# Patient Record
Sex: Female | Born: 1965 | Race: Asian | Hispanic: No | Smoking: Former smoker
Health system: Southern US, Community
[De-identification: ages and names within clinical notes are randomized; demographics above are authoritative.]

## PROBLEM LIST (undated history)

## (undated) DIAGNOSIS — R87619 Unspecified abnormal cytological findings in specimens from cervix uteri: Secondary | ICD-10-CM

## (undated) DIAGNOSIS — E079 Disorder of thyroid, unspecified: Secondary | ICD-10-CM

## (undated) DIAGNOSIS — A64 Unspecified sexually transmitted disease: Secondary | ICD-10-CM

## (undated) DIAGNOSIS — R000 Tachycardia, unspecified: Secondary | ICD-10-CM

## (undated) DIAGNOSIS — M5126 Other intervertebral disc displacement, lumbar region: Secondary | ICD-10-CM

## (undated) HISTORY — DX: Tachycardia, unspecified: R00.0

## (undated) HISTORY — PX: BREAST CYST EXCISION: SHX579

## (undated) HISTORY — PX: BREAST BIOPSY: SHX20

## (undated) HISTORY — DX: Disorder of thyroid, unspecified: E07.9

## (undated) HISTORY — DX: Unspecified sexually transmitted disease: A64

## (undated) HISTORY — DX: Other intervertebral disc displacement, lumbar region: M51.26

## (undated) HISTORY — DX: Unspecified abnormal cytological findings in specimens from cervix uteri: R87.619

---

## 2021-10-20 ENCOUNTER — Ambulatory Visit: Payer: Self-pay | Admitting: Family

## 2021-11-17 ENCOUNTER — Ambulatory Visit: Payer: BC Managed Care – PPO | Admitting: Family

## 2021-11-17 ENCOUNTER — Other Ambulatory Visit: Payer: Self-pay

## 2021-11-17 ENCOUNTER — Encounter: Payer: Self-pay | Admitting: Family

## 2021-11-17 VITALS — BP 118/80 | HR 89 | Temp 98.1°F | Ht 62.0 in | Wt 128.6 lb

## 2021-11-17 DIAGNOSIS — Z1211 Encounter for screening for malignant neoplasm of colon: Secondary | ICD-10-CM

## 2021-11-17 DIAGNOSIS — I10 Essential (primary) hypertension: Secondary | ICD-10-CM

## 2021-11-17 DIAGNOSIS — Z124 Encounter for screening for malignant neoplasm of cervix: Secondary | ICD-10-CM

## 2021-11-17 DIAGNOSIS — Z1231 Encounter for screening mammogram for malignant neoplasm of breast: Secondary | ICD-10-CM | POA: Diagnosis not present

## 2021-11-17 NOTE — Progress Notes (Signed)
New Patient Office Visit  Subjective:  Patient ID: Maria Shannon, female    DOB: 07-23-1966  Age: 56 y.o. MRN: [id number]  CC:  Chief Complaint  Patient presents with   Establish Care   Hypertension    HPI Maria Shannon presents for establishing care and to discuss one problem. Hypertension: Patient is currently maintained on the following medications for blood pressure: Metoprolol Patient reports good compliance with blood pressure medications. Patient denies chest pain, headaches, shortness of breath or swelling. Last 3 blood pressure readings in our office are as follows: BP Readings from Last 3 Encounters:  11/17/21 118/80   History reviewed. No pertinent past medical history.  History reviewed. No pertinent surgical history.  Family History  Problem Relation Age of Onset   Diabetes Mother    Cancer Mother    Kidney disease Father    Heart disease Father    Diabetes Sister     Social History   Socioeconomic History   Marital status: Single    Spouse name: Not on file   Number of children: Not on file   Years of education: Not on file   Highest education level: Not on file  Occupational History   Not on file  Tobacco Use   Smoking status: Former    Types: Cigarettes   Smokeless tobacco: Never  Substance and Sexual Activity   Alcohol use: Yes    Alcohol/week: 1.0 standard drink    Types: 1 Glasses of wine per week   Drug use: Never   Sexual activity: Not Currently  Other Topics Concern   Not on file  Social History Narrative   Not on file   Social Determinants of Health   Financial Resource Strain: Not on file  Food Insecurity: Not on file  Transportation Needs: Not on file  Physical Activity: Not on file  Stress: Not on file  Social Connections: Not on file  Intimate Partner Violence: Not on file    Objective:   Today's Vitals: BP 118/80    Pulse 89    Temp 98.1 F (36.7 C) (Temporal)    Ht 5\' 2"  (1.575 m)    Wt 128 lb 9.6 oz (58.3  kg)    SpO2 99%    BMI 23.52 kg/m   Physical Exam Vitals and nursing note reviewed.  Constitutional:      Appearance: Normal appearance.  Cardiovascular:     Rate and Rhythm: Normal rate and regular rhythm.  Pulmonary:     Effort: Pulmonary effort is normal.     Breath sounds: Normal breath sounds.  Musculoskeletal:        General: Normal range of motion.  Skin:    General: Skin is warm and dry.  Neurological:     Mental Status: She is alert.  Psychiatric:        Mood and Affect: Mood normal.        Behavior: Behavior normal.    Assessment & Plan:   Problem List Items Addressed This Visit       Cardiovascular and Mediastinum   Essential hypertension - Primary    Chronic - pt takes Metoprolol mostly for tachycardia, but reports having high BP also, pt has monitor at home - reports up to 163 systolic and she will have a HA. States after taking Metoprolol it comes down some but not to normal. Advised pt we can add another med, prefer not to increase Metoprolol, HR is doing good. pt will schedule a f/u physical  soon.      Relevant Medications   metoprolol succinate (TOPROL-XL) 25 MG 24 hr tablet   Other Visit Diagnoses     Colon cancer screening       Relevant Orders   Ambulatory referral to Gastroenterology   Encounter for screening mammogram for malignant neoplasm of breast       Relevant Orders   MM DIGITAL SCREENING BILATERAL   Cervical cancer screening       Relevant Orders   Ambulatory referral to Obstetrics / Gynecology       Outpatient Encounter Medications as of 11/17/2021  Medication Sig   metoprolol succinate (TOPROL-XL) 25 MG 24 hr tablet Take 25 mg by mouth daily.   No facility-administered encounter medications on file as of 11/17/2021.    Follow-up: Return for Complete physical w/fasting labs.   Maria Sewer, NP

## 2021-11-17 NOTE — Patient Instructions (Signed)
Welcome to [facility]! It was a pleasure meeting you today.  As discussed, I have sent referrals to Gastroenterology, the [facility], and to our Gynecology offices. They will call you directly to schedule appointments.  Please schedule a physical today for fasting labs.     PLEASE NOTE:  If you had any LAB tests please let us know if you have not heard back within a few days. You may see your results on MyChart before we have a chance to review them but we will give you a call once they are reviewed by [place]. If we ordered any REFERRALS today, please let us know if you have not heard from their office within the next week.  Let us know through MyChart if you are needing REFILLS, or have your pharmacy send [place] the request. You can also use MyChart to communicate with me or any office staff.  Please try these tips to maintain a healthy lifestyle:  Eat most of your calories during the day when you are active. Eliminate processed foods including packaged sweets (pies, cakes, cookies), reduce intake of potatoes, white bread, white pasta, and white rice. Look for whole grain options, oat flour or almond flour.  Each meal should contain half fruits/vegetables, one quarter protein, and one quarter carbs (no bigger than a computer mouse).  Cut down on sweet beverages. This includes juice, soda, and sweet tea. Also watch fruit intake, though this is a healthier sweet option, it still contains natural sugar! Limit to 3 servings daily.  Drink at least 1 glass of water with each meal and aim for at least 8 glasses per day  Exercise at least 150 minutes every week.

## 2021-11-17 NOTE — Assessment & Plan Note (Addendum)
Chronic - pt takes Metoprolol mostly for tachycardia, but reports having high BP also, pt has monitor at home - reports up to 809 systolic and she will have a HA. States after taking Metoprolol it comes down some but not to normal. Advised pt we can add another med, prefer not to increase Metoprolol, HR is doing good. pt will schedule a f/u physical soon.

## 2021-12-14 ENCOUNTER — Encounter: Payer: Self-pay | Admitting: Gastroenterology

## 2021-12-22 ENCOUNTER — Ambulatory Visit (AMBULATORY_SURGERY_CENTER): Payer: BC Managed Care – PPO | Admitting: *Deleted

## 2021-12-22 VITALS — Ht 62.0 in | Wt 128.0 lb

## 2021-12-22 DIAGNOSIS — Z1211 Encounter for screening for malignant neoplasm of colon: Secondary | ICD-10-CM

## 2021-12-22 MED ORDER — NA SULFATE-K SULFATE-MG SULF 17.5-3.13-1.6 GM/177ML PO SOLN: 2.0000 | Freq: Once | ORAL | 0 refills | Status: AC

## 2021-12-22 NOTE — Progress Notes (Signed)

## 2022-01-01 ENCOUNTER — Encounter: Payer: Self-pay | Admitting: Gastroenterology

## 2022-01-07 ENCOUNTER — Telehealth: Payer: Self-pay

## 2022-01-07 NOTE — Telephone Encounter (Signed)
Left message for pt to call office back regarding referral that was sent to [facility].  

## 2022-01-12 ENCOUNTER — Encounter: Payer: Self-pay | Admitting: Gastroenterology

## 2022-01-12 ENCOUNTER — Ambulatory Visit (AMBULATORY_SURGERY_CENTER): Payer: BC Managed Care – PPO | Admitting: Gastroenterology

## 2022-01-12 VITALS — BP 119/67 | HR 68 | Temp 98.9°F | Resp 11 | Ht 62.0 in | Wt 128.0 lb

## 2022-01-12 DIAGNOSIS — Z1211 Encounter for screening for malignant neoplasm of colon: Secondary | ICD-10-CM

## 2022-01-12 DIAGNOSIS — D125 Benign neoplasm of sigmoid colon: Secondary | ICD-10-CM

## 2022-01-12 DIAGNOSIS — D122 Benign neoplasm of ascending colon: Secondary | ICD-10-CM | POA: Diagnosis not present

## 2022-01-12 DIAGNOSIS — K5730 Diverticulosis of large intestine without perforation or abscess without bleeding: Secondary | ICD-10-CM

## 2022-01-12 MED ORDER — SODIUM CHLORIDE 0.9 % IV SOLN: 500.0000 mL | INTRAVENOUS | Status: DC

## 2022-01-12 NOTE — Patient Instructions (Signed)
YOU HAD AN ENDOSCOPIC PROCEDURE TODAY AT [facility]:   Refer to the procedure report that was given to you for any specific questions about what was found during the examination.  If the procedure report does not answer your questions, please call your gastroenterologist to clarify.  If you requested that your care partner not be given the details of your procedure findings, then the procedure report has been included in a sealed envelope for you to review at your convenience later. ? ?**Handout given on polyps and diverticulosis** ? ?YOU SHOULD EXPECT: Some feelings of bloating in the abdomen. Passage of more gas than usual.  Walking can help get rid of the air that was put into your GI tract during the procedure and reduce the bloating. If you had a lower endoscopy (such as a colonoscopy or flexible sigmoidoscopy) you may notice spotting of blood in your stool or on the toilet paper. If you underwent a bowel prep for your procedure, you may not have a normal bowel movement for a few days. ? ?Please Note:  You might notice some irritation and congestion in your nose or some drainage.  This is from the oxygen used during your procedure.  There is no need for concern and it should clear up in a day or so. ? ?SYMPTOMS TO REPORT IMMEDIATELY: ? ?Following lower endoscopy (colonoscopy or flexible sigmoidoscopy): ? Excessive amounts of blood in the stool ? Significant tenderness or worsening of abdominal pains ? Swelling of the abdomen that is new, acute ? Fever of 100?F or higher ? ?For urgent or emergent issues, a gastroenterologist can be reached at any hour by calling [phone]. ?Do not use MyChart messaging for urgent concerns.  ? ? ?DIET:  We do recommend a small meal at first, but then you may proceed to your regular diet.  Drink plenty of fluids but you should avoid alcoholic beverages for 24 hours. ? ?ACTIVITY:  You should plan to take it easy for the rest of today and you should NOT DRIVE  or use heavy machinery until tomorrow (because of the sedation medicines used during the test).   ? ?FOLLOW UP: ?Our staff will call the number listed on your records 48-72 hours following your procedure to check on you and address any questions or concerns that you may have regarding the information given to you following your procedure. If we do not reach you, we will leave a message.  We will attempt to reach you two times.  During this call, we will ask if you have developed any symptoms of COVID 19. If you develop any symptoms (ie: fever, flu-like symptoms, shortness of breath, cough etc.) before then, please call [phone].  If you test positive for Covid 19 in the 2 weeks post procedure, please call and report this information to [place].   ? ?If any biopsies were taken you will be contacted by phone or by letter within the next 1-3 weeks.  Please call us at [phone] if you have not heard about the biopsies in 3 weeks.  ? ? ?SIGNATURES/CONFIDENTIALITY: ?You and/or your care partner have signed paperwork which will be entered into your electronic medical record.  These signatures attest to the fact that that the information above on your After Visit Summary has been reviewed and is understood.  Full responsibility of the confidentiality of this discharge information lies with you and/or your care-partner.  ?

## 2022-01-12 NOTE — Progress Notes (Signed)
Called to room to assist during endoscopic procedure.  Patient ID and intended procedure confirmed with present staff. Received instructions for my participation in the procedure from the performing physician.  

## 2022-01-12 NOTE — Op Note (Signed)
[facility] ?Patient Name: Maria Shannon ?Procedure Date: 01/12/2022 2:04 PM ?MRN: [id number] ?Endoscopist: Gerrit Heck , MD ?Age: 56 ?Referring MD:  ?Date of Birth: [birth year] ?Gender: Female ?Account #: [id number] ?Procedure:                Colonoscopy ?Indications:              Screening for colorectal malignant neoplasm. Last  ?                          colonoscopy was 10 years ago at an outside  ?                          facility. Otherwise, no active GI symptoms. ?Medicines:                Monitored Anesthesia Care ?Procedure:                Pre-Anesthesia Assessment: ?                          - Prior to the procedure, a History and Physical  ?                          was performed, and patient medications and  ?                          allergies were reviewed. The patient's tolerance of  ?                          previous anesthesia was also reviewed. The risks  ?                          and benefits of the procedure and the sedation  ?                          options and risks were discussed with the patient.  ?                          All questions were answered, and informed consent  ?                          was obtained. Prior Anticoagulants: The patient has  ?                          taken no previous anticoagulant or antiplatelet  ?                          agents. ASA Grade Assessment: II - A patient with  ?                          mild systemic disease. After reviewing the risks  ?                          and benefits, the patient was deemed in  ?  satisfactory condition to undergo the procedure. ?                          After obtaining informed consent, the colonoscope  ?                          was passed under direct vision. Throughout the  ?                          procedure, the patient's blood pressure, pulse, and  ?                          oxygen saturations were monitored continuously. The  ?                          Olympus CF-[id number]  (#[id number]) Colonoscope was  ?                          introduced through the anus and advanced to the the  ?                          cecum, identified by appendiceal orifice and  ?                          ileocecal valve. The colonoscopy was technically  ?                          difficult and complex due to a tortuous colon. The  ?                          patient tolerated the procedure well. The quality  ?                          of the bowel preparation was good. The ileocecal  ?                          valve, appendiceal orifice, and rectum were  ?                          photographed. ?Scope In: 2:16:11 PM ?Scope Out: 2:37:10 PM ?Scope Withdrawal Time: 0 hours 15 minutes 2 seconds  ?Total Procedure Duration: 0 hours 20 minutes 59 seconds  ?Findings:                 The perianal and digital rectal examinations were  ?                          normal. ?                          Two sessile polyps were found in the sigmoid colon  ?                          and ascending colon. The polyps were 3 to 5 mm in  ?  size. These polyps were removed with a cold snare.  ?                          Resection and retrieval were complete. Estimated  ?                          blood loss was minimal. ?                          Multiple small and large-mouthed diverticula were  ?                          found in the sigmoid colon, descending colon and  ?                          transverse colon. The sigmoid colon was tortuous. ?                          The retroflexed view of the distal rectum and anal  ?                          verge was normal and showed no anal or rectal  ?                          abnormalities. ?Complications:            No immediate complications. ?Estimated Blood Loss:     Estimated blood loss was minimal. ?Impression:               - Two 3 to 5 mm polyps in the sigmoid colon and in  ?                          the ascending colon, removed with a cold snare.  ?                           Resected and retrieved. ?                          - Diverticulosis in the sigmoid colon, in the  ?                          descending colon and in the transverse colon. ?                          - Tortuous sigmoid colon. ?                          - The distal rectum and anal verge are normal on  ?                          retroflexion view. ?Recommendation:           - Patient has a contact number available for  ?  emergencies. The signs and symptoms of potential  ?                          delayed complications were discussed with the  ?                          patient. Return to normal activities tomorrow.  ?                          Written discharge instructions were provided to the  ?                          patient. ?                          - Resume previous diet. ?                          - Continue present medications. ?                          - Await pathology results. ?                          - Repeat colonoscopy for surveillance based on  ?                          pathology results. ?                          - Return to GI office PRN. ?Gerrit Heck, MD ?01/12/2022 2:42:53 PM ?

## 2022-01-12 NOTE — Progress Notes (Signed)
Pt's states no medical or surgical changes since previsit or office visit. 

## 2022-01-12 NOTE — Progress Notes (Signed)
? ?GASTROENTEROLOGY PROCEDURE H&P NOTE  ? ?Primary Care Physician: ?Jeanie Sewer, NP ? ? ? ?Reason for Procedure:  Colon Cancer screening ? ?Plan:    Colonoscopy ? ?Patient is appropriate for endoscopic procedure(s) in the ambulatory ([place]) setting. ? ?The nature of the procedure, as well as the risks, benefits, and alternatives were carefully and thoroughly reviewed with the patient. Ample time for discussion and questions allowed. The patient understood, was satisfied, and agreed to proceed.  ? ? ? ?HPI: ?Maria Shannon is a 56 y.o. female who presents for colonoscopy for routine Colon Cancer screening.  No active GI symptoms.  No known family history of colon cancer or related malignancy.  Patient is otherwise without complaints or active issues today. ? ?Past Medical History:  ?Diagnosis Date  ? Lumbar herniated disc   ? Tachycardia   ? ? ?Past Surgical History:  ?Procedure Laterality Date  ? BREAST CYST EXCISION Right   ? ? ?Prior to Admission medications   ?Medication Sig Start Date End Date Taking? Authorizing Provider  ?metoprolol succinate (TOPROL-XL) 25 MG 24 hr tablet Take 25 mg by mouth daily.    [provider]  ? ? ?Current Outpatient Medications  ?Medication Sig Dispense Refill  ? metoprolol succinate (TOPROL-XL) 25 MG 24 hr tablet Take 25 mg by mouth daily.    ? ?Current Facility-Administered Medications  ?Medication Dose Route Frequency Provider Last Rate Last Admin  ? 0.9 %  sodium chloride infusion  500 mL Intravenous Continuous Adron Geisel V, DO      ? ? ?Allergies as of 01/12/2022  ? (No Known Allergies)  ? ? ?Family History  ?Problem Relation Age of Onset  ? Diabetes Mother   ? Cancer Mother   ? Kidney disease Father   ? Heart disease Father   ? Diabetes Sister   ? Colon polyps Neg Hx   ? Colon cancer Neg Hx   ? Esophageal cancer Neg Hx   ? Stomach cancer Neg Hx   ? Rectal cancer Neg Hx   ? ? ?Social History  ? ?Socioeconomic History  ? Marital status: Single  ?  Spouse  name: Not on file  ? Number of children: Not on file  ? Years of education: Not on file  ? Highest education level: Not on file  ?Occupational History  ? Not on file  ?Tobacco Use  ? Smoking status: Former  ?  Types: Cigarettes  ? Smokeless tobacco: Never  ?Vaping Use  ? Vaping Use: Never used  ?Substance and Sexual Activity  ? Alcohol use: Yes  ?  Alcohol/week: 1.0 standard drink  ?  Types: 1 Glasses of wine per week  ? Drug use: Never  ? Sexual activity: Not Currently  ?Other Topics Concern  ? Not on file  ?Social History Narrative  ? Not on file  ? ?Social Determinants of Health  ? ?Financial Resource Strain: Not on file  ?Food Insecurity: Not on file  ?Transportation Needs: Not on file  ?Physical Activity: Not on file  ?Stress: Not on file  ?Social Connections: Not on file  ?Intimate Partner Violence: Not on file  ? ? ?Physical Exam: ?Vital signs in last 24 hours: ?'@BP'$  115/70   Pulse 99   Temp 98.9 ?F (37.2 ?C)   Ht '5\' 2"'$  (1.575 m)   Wt 128 lb (58.1 kg)   SpO2 97%   BMI 23.41 kg/m?  ?GEN: NAD ?EYE: Sclerae anicteric ?ENT: MMM ?CV: Non-tachycardic ?Pulm: CTA b/l ?GI: Soft, NT/ND ?  NEURO:  Alert & Oriented x 3 ? ? ?Gerrit Heck, DO ?[facility] ? ? ?01/12/2022 2:10 PM ? ?

## 2022-01-12 NOTE — Progress Notes (Signed)
To Pacu, VSS. Report to RN.tb 

## 2022-01-13 ENCOUNTER — Ambulatory Visit: Payer: BC Managed Care – PPO | Admitting: Family

## 2022-01-13 ENCOUNTER — Encounter: Payer: Self-pay | Admitting: Family

## 2022-01-13 ENCOUNTER — Other Ambulatory Visit (HOSPITAL_COMMUNITY)
Admission: RE | Admit: 2022-01-13 | Discharge: 2022-01-13 | Disposition: A | Discharge status: Home / Self Care | Payer: BC Managed Care – PPO | Source: Ambulatory Visit | Attending: Family | Admitting: Family

## 2022-01-13 VITALS — BP 122/72 | HR 95 | Temp 98.7°F | Ht 62.0 in | Wt 126.2 lb

## 2022-01-13 DIAGNOSIS — R000 Tachycardia, unspecified: Secondary | ICD-10-CM | POA: Insufficient documentation

## 2022-01-13 DIAGNOSIS — Z124 Encounter for screening for malignant neoplasm of cervix: Secondary | ICD-10-CM | POA: Insufficient documentation

## 2022-01-13 NOTE — Progress Notes (Signed)
? ?  Subjective:  ? ? ? Patient ID: Maria Shannon, female    DOB: 1966-02-03, 56 y.o.   MRN: [id number] ? ?Chief Complaint  ?Patient presents with  ? Gynecologic Exam  ? ? ?HPI: ?PAP smear testing: pt is here for her PAP smear test. Last testing completed prior to pandemic, she thinks around 2019 and it was normal. Denies any vaginal concerns today. ? ?Assessment & Plan:  ? ?Problem List Items Addressed This Visit   ?None ?Visit Diagnoses   ? ? Encounter for Pap smear of cervix with HPV DNA cotesting    -  Primary  ? Relevant Orders  ? Cytology - PAP  ? ?  ? ? ?Outpatient Medications Prior to Visit  ?Medication Sig Dispense Refill  ? metoprolol succinate (TOPROL-XL) 25 MG 24 hr tablet Take 25 mg by mouth daily.    ? Na Sulfate-K Sulfate-Mg Sulf 17.5-3.13-1.6 GM/177ML SOLN SMARTSIG:2 Bottle(s) By Mouth Once (Patient not taking: Reported on 01/13/2022)    ? ?No facility-administered medications prior to visit.  ? ? ?Past Medical History:  ?Diagnosis Date  ? Lumbar herniated disc   ? Tachycardia   ? ? ?Past Surgical History:  ?Procedure Laterality Date  ? BREAST CYST EXCISION Right   ? ? ?No Known Allergies ? ?   ?Objective:  ?  ?Physical Exam ?Vitals and nursing note reviewed.  ?Constitutional:   ?   Appearance: Normal appearance.  ?Cardiovascular:  ?   Rate and Rhythm: Normal rate and regular rhythm.  ?Pulmonary:  ?   Effort: Pulmonary effort is normal.  ?   Breath sounds: Normal breath sounds.  ?Genitourinary: ?   Exam position: Lithotomy position.  ?   Pubic Area: No rash.   ?   Labia:     ?   Right: No rash.     ?   Left: No rash.   ?   Vagina: Normal.  ?   Comments: PAP smear specimen obtained. ?Musculoskeletal:     ?   General: Normal range of motion.  ?Skin: ?   General: Skin is warm and dry.  ?Neurological:  ?   Mental Status: She is alert.  ?Psychiatric:     ?   Mood and Affect: Mood normal.     ?   Behavior: Behavior normal.  ? ? ?BP 122/72 (BP Location: Left Arm, Patient Position: Sitting, Cuff Size: Large)    Pulse 95   Temp 98.7 ?F (37.1 ?C) (Temporal)   Ht '5\' 2"'$  (1.575 m)   Wt 126 lb 4 oz (57.3 kg)   SpO2 96%   BMI 23.09 kg/m?  ?Wt Readings from Last 3 Encounters:  ?01/13/22 126 lb 4 oz (57.3 kg)  ?01/12/22 128 lb (58.1 kg)  ?12/22/21 128 lb (58.1 kg)  ? ?   ? ?Jeanie Sewer, NP ? ?

## 2022-01-14 ENCOUNTER — Ambulatory Visit
Admission: RE | Admit: 2022-01-14 | Discharge: 2022-01-14 | Disposition: A | Discharge status: Home / Self Care | Payer: BC Managed Care – PPO | Source: Ambulatory Visit | Attending: Family | Admitting: Family

## 2022-01-14 ENCOUNTER — Telehealth: Payer: Self-pay

## 2022-01-14 DIAGNOSIS — Z1231 Encounter for screening mammogram for malignant neoplasm of breast: Secondary | ICD-10-CM

## 2022-01-14 NOTE — Telephone Encounter (Signed)
  Follow up Call-     01/12/2022    1:09 PM  Call back number  Post procedure Call Back phone  # [phone]  Permission to leave phone message Yes     Patient questions:  Do you have a fever, pain , or abdominal swelling? No. Pain Score  0 *  Have you tolerated food without any problems? Yes.    Have you been able to return to your normal activities? Yes.    Do you have any questions about your discharge instructions: Diet   No. Medications  No. Follow up visit  No.  Do you have questions or concerns about your Care? No.  Actions: * If pain score is 4 or above: No action needed, pain <4.

## 2022-01-18 LAB — CYTOLOGY - PAP
Adequacy: ABNORMAL
Comment: NEGATIVE
Comment: NEGATIVE
Comment: NEGATIVE
HPV 16: NEGATIVE
HPV 18 / 45: NEGATIVE
High risk HPV: POSITIVE — AB

## 2022-01-18 NOTE — Progress Notes (Signed)
Hi Maria Shannon, ? 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  ?Please schedule this at your convenience.

## 2022-01-20 ENCOUNTER — Encounter: Payer: Self-pay | Admitting: Gastroenterology

## 2022-01-20 ENCOUNTER — Encounter: Payer: Self-pay | Admitting: Family

## 2022-01-20 ENCOUNTER — Other Ambulatory Visit (HOSPITAL_COMMUNITY)
Admission: RE | Admit: 2022-01-20 | Discharge: 2022-01-20 | Disposition: A | Discharge status: Home / Self Care | Payer: BC Managed Care – PPO | Source: Ambulatory Visit | Attending: Family | Admitting: Family

## 2022-01-20 ENCOUNTER — Ambulatory Visit (INDEPENDENT_AMBULATORY_CARE_PROVIDER_SITE_OTHER): Payer: BC Managed Care – PPO | Admitting: Family

## 2022-01-20 VITALS — BP 112/71 | HR 104 | Temp 98.4°F | Ht 62.0 in | Wt 124.1 lb

## 2022-01-20 DIAGNOSIS — Z124 Encounter for screening for malignant neoplasm of cervix: Secondary | ICD-10-CM | POA: Diagnosis not present

## 2022-01-20 NOTE — Progress Notes (Signed)
? ?  Subjective:  ? ? ? Patient ID: Maria Shannon, female    DOB: 1965/12/26, 56 y.o.   MRN: [id number] ? ?Chief Complaint  ?Patient presents with  ? Gynecologic Exam  ?  Redo.   ? ?HPI: ? ?PAP smear testing: pt is here for her PAP smear test. Last testing completed prior to pandemic, she thinks around 2019 and it was normal. Denies any vaginal concerns today. ? ?Assessment & Plan:  ? ?Problem List Items Addressed This Visit   ?None ?Visit Diagnoses   ? ? Encounter for repeat Papanicolaou smear of cervix    -  Primary  ? Insufficient cells for testing, requiring repeat today. ? ?Relevant Orders  ? Cytology - PAP  ? ?  ? ? ?Outpatient Medications Prior to Visit  ?Medication Sig Dispense Refill  ? metoprolol succinate (TOPROL-XL) 25 MG 24 hr tablet Take 25 mg by mouth daily.    ? ?No facility-administered medications prior to visit.  ? ? ?Past Medical History:  ?Diagnosis Date  ? Lumbar herniated disc   ? Tachycardia   ? ? ?Past Surgical History:  ?Procedure Laterality Date  ? BREAST BIOPSY Right   ? BREAST CYST EXCISION Right   ? ? ?No Known Allergies ? ?   ?Objective:  ?  ?Physical Exam ?Vitals and nursing note reviewed.  ?Constitutional:   ?   Appearance: Normal appearance.  ?Cardiovascular:  ?   Rate and Rhythm: Normal rate and regular rhythm.  ?Pulmonary:  ?   Effort: Pulmonary effort is normal.  ?   Breath sounds: Normal breath sounds.  ?Genitourinary: ?   Exam position: Lithotomy position.  ?   Vagina: Normal.  ?   Cervix: Normal.  ?   Comments: PAP smear obtained, minimal discharge ?Musculoskeletal:     ?   General: Normal range of motion.  ?Skin: ?   General: Skin is warm and dry.  ?Neurological:  ?   Mental Status: She is alert.  ?Psychiatric:     ?   Mood and Affect: Mood normal.     ?   Behavior: Behavior normal.  ? ? ?BP 112/71 (BP Location: Left Arm, Patient Position: Sitting, Cuff Size: Large)   Pulse (!) 104   Temp 98.4 ?F (36.9 ?C) (Temporal)   Ht '5\' 2"'$  (1.575 m)   Wt 124 lb 2 oz (56.3 kg)    SpO2 94%   BMI 22.70 kg/m?  ?Wt Readings from Last 3 Encounters:  ?01/20/22 124 lb 2 oz (56.3 kg)  ?01/13/22 126 lb 4 oz (57.3 kg)  ?01/12/22 128 lb (58.1 kg)  ? ?   ? ?Jeanie Sewer, NP ? ?

## 2022-01-22 LAB — CYTOLOGY - PAP: Diagnosis: UNDETERMINED — AB

## 2022-01-24 ENCOUNTER — Other Ambulatory Visit: Payer: Self-pay | Admitting: Family

## 2022-01-24 DIAGNOSIS — R87610 Atypical squamous cells of undetermined significance on cytologic smear of cervix (ASC-US): Secondary | ICD-10-CM | POA: Insufficient documentation

## 2022-01-24 NOTE — Progress Notes (Signed)
Hi Maria Shannon, ? ?Your PAP smear came back positive for some atypical cervical cells, and along with your positive HPV result (though not one of the high risk HPVs that usually cause cervial cancer) it is still recommended that you follow up with a gynecologist for possible colposcopy biopsy to be safe.   ?I have sent the referral, let me know if you don't hear anything this week.

## 2022-01-25 ENCOUNTER — Telehealth: Payer: Self-pay | Admitting: Family

## 2022-01-25 ENCOUNTER — Other Ambulatory Visit: Payer: Self-pay

## 2022-01-25 DIAGNOSIS — I10 Essential (primary) hypertension: Secondary | ICD-10-CM

## 2022-01-25 MED ORDER — METOPROLOL SUCCINATE ER 25 MG PO TB24: 25.0000 mg | ORAL_TABLET | Freq: Every day | ORAL | 1 refills | Status: DC

## 2022-01-25 NOTE — Telephone Encounter (Signed)
RX sent

## 2022-01-25 NOTE — Telephone Encounter (Signed)
Pt states she is expecting a 3 month supply since she will be traveling for an extended amount of time. Pt states this was discussed during appointment. ? ?.. ?Encourage patient to contact the pharmacy for refills or they can request refills through [facility] ? ?LAST APPOINTMENT DATE:   ?04/26 or 05/03 ? ?NEXT APPOINTMENT DATE: ?N/a ? ?MEDICATION: ?metoprolol succinate (TOPROL-XL) 25 MG 24 hr tablet [[id number]]  ? ? ?Is the patient out of medication?  ?Yes ? ?PHARMACY: ?[id number] [place], [place] - [address]  [id number] ?Phone:  [phone] Fax:  [phone] ? ?Let patient know to contact pharmacy at the end of the day to make sure medication is ready. ? ?Please notify patient to allow 48-72 hours to process  ?

## 2022-02-09 ENCOUNTER — Ambulatory Visit: Payer: BC Managed Care – PPO | Admitting: Obstetrics & Gynecology

## 2022-02-10 ENCOUNTER — Ambulatory Visit: Payer: BC Managed Care – PPO

## 2022-02-10 ENCOUNTER — Ambulatory Visit: Payer: BC Managed Care – PPO | Admitting: Obstetrics & Gynecology

## 2022-02-10 ENCOUNTER — Encounter: Payer: Self-pay | Admitting: Obstetrics & Gynecology

## 2022-02-10 ENCOUNTER — Other Ambulatory Visit (HOSPITAL_COMMUNITY)
Admission: RE | Admit: 2022-02-10 | Discharge: 2022-02-10 | Disposition: A | Discharge status: Home / Self Care | Payer: BC Managed Care – PPO | Source: Ambulatory Visit | Attending: Obstetrics & Gynecology | Admitting: Obstetrics & Gynecology

## 2022-02-10 VITALS — BP 120/84

## 2022-02-10 DIAGNOSIS — N841 Polyp of cervix uteri: Secondary | ICD-10-CM

## 2022-02-10 DIAGNOSIS — R87610 Atypical squamous cells of undetermined significance on cytologic smear of cervix (ASC-US): Secondary | ICD-10-CM | POA: Diagnosis not present

## 2022-02-10 DIAGNOSIS — R87810 Cervical high risk human papillomavirus (HPV) DNA test positive: Secondary | ICD-10-CM

## 2022-02-10 NOTE — Progress Notes (Signed)
    Maria Shannon 1966-01-06 [id number]        56 y.o.  G2P2L2 Separated.  RP: ASCUS/HPV HR Pos for Colposcopy  HPI: Pap 01/20/22 showed ASCUS/HPV HR Pos.  HPV 16-18-45 Neg.  Previous Paps Neg, last was around 2019.  Sexually active since separation from her husband.  OB History  Gravida Para Term Preterm AB Living  '2 2 2     2  '$ SAB IAB Ectopic Multiple Live Births               # Outcome Date GA Lbr Len/2nd Weight Sex Delivery Anes PTL Lv  2 Term           1 Term             Past medical history,surgical history, problem list, medications, allergies, family history and social history were all reviewed and documented in the EPIC chart.   Directed ROS with pertinent positives and negatives documented in the history of present illness/assessment and plan.  Exam:  Vitals:   02/10/22 0826  BP: 120/84   General appearance:  Normal  Colposcopy Procedure Note Maria Shannon 02/10/2022  Indications:  ASCUS/HPV HR Pos  Procedure Details  The risks and benefits of the procedure and Written informed consent obtained.  Speculum placed in vagina and excellent visualization of cervix achieved, cervix swabbed x 3 with acetic acid solution.  Findings:  Cervix colposcopy: Physical Exam Genitourinary:       Vaginal colposcopy: Normal  Vulvar colposcopy: Normal  Perirectal colposcopy: Normal  The cervix was sprayed with Hurricane before performing the cervical biopsies.  Specimens: Cervical Biopsies at 3, 7 and 11 O'Clock.  Endocervical Polyp.  Complications: None, good hemostasis with Silver Nitrate . Plan:  Management per results   Assessment/Plan:  56 y.o. G2P2002   1. ASCUS with positive high risk HPV cervical Pap 01/20/22 showed ASCUS/HPV HR Pos.  HPV 16-18-45 Neg.  Previous Paps Neg, last was around 2019.  Sexually active since separation from her husband.  Counseling on ASCUS and HR HPV.  Management reviewed. Colposcopy procedure explained.  Colpo  findings reviewed.  Post procedure precautions.  Management per results. - Surgical pathology( [facility]/ [facility])  2. Cervical polyp Endocervical Polyp excised. - Surgical pathology( [facility])  Other orders - Ascorbic Acid (VITAMIN C PO); Take by mouth.   Counseling and management of ASCUS and HR HPV for 30 minutes.  Princess Bruins MD, 8:38 AM 02/10/2022

## 2022-02-16 LAB — SURGICAL PATHOLOGY

## 2022-02-23 ENCOUNTER — Encounter: Payer: BC Managed Care – PPO | Admitting: Obstetrics and Gynecology

## 2022-03-01 ENCOUNTER — Telehealth: Payer: Self-pay

## 2022-03-01 NOTE — Telephone Encounter (Signed)
-----   Message from Princess Bruins, MD sent at 02/22/2022  2:20 PM EDT ----- Patho:  Mild Dysplasia (CIN 1).  Repeat Pap in 6 months.             Benign Polyp  FINAL MICROSCOPIC DIAGNOSIS:   A. 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 CERVIX, 11 O'CLOCK, BIOPSY:  Mild squamous dysplasia with HPV effect (LSIL, CIN-1)   D. CERVIX, POLYPECTOMY:  Abundant mucohemorrhagic debris with scant admixed benign endocervical  epithelium and endocervix suggestive of a polypoid nabothian cyst

## 2022-03-01 NOTE — Telephone Encounter (Signed)
Spoke with patient and informed her of results. She is in [place].  She c/o still spotting and also lower left sided sharp pain. She said it is a small area and she can place her finger on it. The area where the pain is is under her finger.  Happens every day but is not persistant. Just occasional.  No UTI sx and otherwise feels fine.

## 2022-03-16 NOTE — Telephone Encounter (Signed)
Genia Del, MD  You 1 hour ago (1:16 PM)   Ok, recommend observation with Ibuprofen as needed if tolerates it.    I called and spoke with patient. Her call was two weeks ago and she said that she is fine now.  No more left sided pain.

## 2022-06-14 ENCOUNTER — Encounter: Payer: Self-pay | Admitting: *Deleted

## 2022-09-02 ENCOUNTER — Encounter: Payer: Self-pay | Admitting: *Deleted

## 2023-02-15 ENCOUNTER — Ambulatory Visit: Payer: Commercial Managed Care - PPO | Admitting: Physician Assistant

## 2023-02-15 VITALS — BP 116/78 | HR 94 | Temp 98.0°F | Ht 62.0 in | Wt 126.8 lb

## 2023-02-15 DIAGNOSIS — U071 COVID-19: Secondary | ICD-10-CM | POA: Diagnosis not present

## 2023-02-15 DIAGNOSIS — I10 Essential (primary) hypertension: Secondary | ICD-10-CM | POA: Diagnosis not present

## 2023-02-15 LAB — POC COVID19 BINAXNOW: SARS Coronavirus 2 Ag: POSITIVE — AB

## 2023-02-15 MED ORDER — NIRMATRELVIR/RITONAVIR (PAXLOVID)TABLET
3.0000 | ORAL_TABLET | Freq: Two times a day (BID) | ORAL | 0 refills | Status: AC
Start: 2023-02-15 — End: 2023-02-20

## 2023-02-15 MED ORDER — METOPROLOL SUCCINATE ER 25 MG PO TB24: 25.0000 mg | ORAL_TABLET | Freq: Every day | ORAL | 3 refills | Status: DC

## 2023-02-15 NOTE — Progress Notes (Signed)
Subjective:    Patient ID: Maria Shannon, female    DOB: 1966-04-17, 57 y.o.   MRN: [id number]  Chief Complaint  Patient presents with   Cough    Pt in office c/o cough and cold symptoms; body aches, head stuffy and some dizziness when getting up; No fever that pt is aware of, pt has had symptoms since Friday night; pt has been taking Mucinex, Zyrtec, and Tylenol since Friday.     HPI Patient is in today for acute sick symptoms x 3-4 days.   Itchy throat, felt hot and flushed Friday night.  Stayed in bed this weekend.  Still having sore throat, body aches, fatigue, congestion, cough, productive of mucous.   Has not taken any medication yet today. She has been taking Mucinex, Zyrtec, Tylenol.  Traveled to [place] the previous weekend.   No recent sick contacts. 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    Past Medical History:  Diagnosis Date   Abnormal Pap smear of cervix    pap 01-20-22 ascus, 01-13-22 hpv hr+, 16,18/45 neg   Lumbar herniated disc    STD (sexually transmitted disease)    hpv   Tachycardia    Thyroid disease    hyperthyroid    Past Surgical History:  Procedure Laterality Date   BREAST BIOPSY Right    BREAST CYST EXCISION Right     Family History  Problem Relation Age of Onset   Diabetes Mother    Cancer Mother    Hypertension Father    Heart disease Father    Stroke Father    Kidney disease Sister    Diabetes Sister     Social History   Tobacco Use   Smoking status: Former    Types: Cigarettes   Smokeless tobacco: Never   Tobacco comments:    College days  Vaping Use   Vaping Use: Never used  Substance Use Topics   Alcohol use: Not Currently    Comment: 1 a month   Drug use: Never     No Known Allergies  Review of Systems NEGATIVE UNLESS OTHERWISE INDICATED IN HPI      Objective:     BP 116/78 (BP Location: Left Arm)   Pulse 94   Temp 98 F (36.7 C) (Temporal)    Ht 5\' 2"  (1.575 m)   Wt 126 lb 12.8 oz (57.5 kg)   SpO2 98%   BMI 23.19 kg/m   Wt Readings from Last 3 Encounters:  02/15/23 126 lb 12.8 oz (57.5 kg)  01/20/22 124 lb 2 oz (56.3 kg)  01/13/22 126 lb 4 oz (57.3 kg)    BP Readings from Last 3 Encounters:  02/15/23 116/78  02/10/22 120/84  01/20/22 112/71     Physical Exam Vitals and nursing note reviewed.  Constitutional:      General: She is not in acute distress.    Appearance: Normal appearance. She is not ill-appearing.     Comments: Tired-appearing  HENT:     Head: Normocephalic.     Right Ear: Tympanic membrane, ear canal and external ear normal.     Left Ear: Tympanic membrane, ear canal and external ear normal.     Nose: No congestion.     Mouth/Throat:     Mouth: Mucous membranes are moist.     Pharynx: No oropharyngeal exudate or posterior oropharyngeal erythema.  Eyes:     Extraocular Movements: Extraocular  movements intact.     Conjunctiva/sclera: Conjunctivae normal.     Pupils: Pupils are equal, round, and reactive to light.  Cardiovascular:     Rate and Rhythm: Normal rate and regular rhythm.     Pulses: Normal pulses.     Heart sounds: Normal heart sounds. No murmur heard. Pulmonary:     Effort: Pulmonary effort is normal. No respiratory distress.     Breath sounds: Normal breath sounds. No wheezing.  Musculoskeletal:     Cervical back: Normal range of motion.  Skin:    General: Skin is warm.  Neurological:     Mental Status: She is alert and oriented to person, place, and time.  Psychiatric:        Mood and Affect: Mood normal.        Behavior: Behavior normal.        Assessment & Plan:  COVID-19 -     POC COVID-19 BinaxNow -     nirmatrelvir/ritonavir; Take 3 tablets by mouth 2 (two) times daily for 5 days. (Take nirmatrelvir 150 mg two tablets twice daily for 5 days and ritonavir 100 mg one tablet twice daily for 5 days) Patient GFR is 100.  Dispense: 30 tablet; Refill: 0  Essential  hypertension -     Metoprolol Succinate ER; Take 1 tablet (25 mg total) by mouth daily.  Dispense: 90 tablet; Refill: 3   POC COVID-19 positive in office today. Will start on Paxlovid at this time as she is within 5 day window of symptoms onset and still feeling so poorly. Risks versus benefits discussed.  Advised self-isolation at home for the next 5 days and then masking around others for at least an additional 5 days.  Treat supportively at this time including sleeping prone, deep breathing exercises, pushing fluids, walking every few hours, vitamins C and D, and Tylenol or ibuprofen as needed.  The patient understands that COVID-19 illness can wax and wane.  Should the symptoms acutely worsen or patient starts to experience sudden shortness of breath, chest pain, severe weakness, the patient will go straight to the emergency department.  Also advised home pulse oximetry monitoring and for any reading consistently under 92%, should also report to the emergency department.  The patient will continue to keep [place] updated.  BP stable, controlled, refilled her metoprolol succinate ER 25 mg daily.    Work note provided. Recheck prn.   Demir Titsworth M Ikhlas Albo, PA-C

## 2023-04-20 ENCOUNTER — Ambulatory Visit: Payer: Commercial Managed Care - PPO | Admitting: Family

## 2023-04-20 ENCOUNTER — Encounter: Payer: Self-pay | Admitting: Family

## 2023-04-20 VITALS — BP 118/77 | HR 84 | Temp 97.7°F | Ht 62.0 in | Wt 129.2 lb

## 2023-04-20 DIAGNOSIS — M25562 Pain in left knee: Secondary | ICD-10-CM | POA: Diagnosis not present

## 2023-04-20 MED ORDER — MELOXICAM 7.5 MG PO TABS
7.5000 mg | ORAL_TABLET | Freq: Two times a day (BID) | ORAL | 1 refills | Status: DC
Start: 2023-04-20 — End: 2023-07-18

## 2023-04-20 NOTE — Progress Notes (Unsigned)
   Patient ID: ROMEE FRON, female    DOB: 22-Mar-1966, 57 y.o.   MRN: [id number]  Chief Complaint  Patient presents with  . Knee Pain    Pt c/o right knee pain and swelling, off and on for months but consistent for 3 weeks. Has tried compressions which does help slightly.     HPI:      Knee pain:  Pt c/o right knee pain and swelling, off and on for months but consistent for 3 weeks. Has tried compressions which does help slightly. She has not tried tylenol or NSAIDs for the pain. Denies any injury or knee problem in the past.      Assessment & Plan:  1. Left knee pain, unspecified chronicity-  - meloxicam (MOBIC) 7.5 MG tablet; Take 1 tablet (7.5 mg total) by mouth 2 (two) times daily.  Dispense: 60 tablet; Refill: 1 - Ambulatory referral to Orthopedic Surgery    Subjective:    Outpatient Medications Prior to Visit  Medication Sig Dispense Refill  . Ascorbic Acid (VITAMIN C PO) Take by mouth.    . metoprolol succinate (TOPROL-XL) 25 MG 24 hr tablet Take 1 tablet (25 mg total) by mouth daily. 90 tablet 3   No facility-administered medications prior to visit.   Past Medical History:  Diagnosis Date  . Abnormal Pap smear of cervix    pap 01-20-22 ascus, 01-13-22 hpv hr+, 16,18/45 neg  . Lumbar herniated disc   . STD (sexually transmitted disease)    hpv  . Tachycardia   . Thyroid disease    hyperthyroid   Past Surgical History:  Procedure Laterality Date  . BREAST BIOPSY Right   . BREAST CYST EXCISION Right    No Known Allergies    Objective:    Physical Exam Vitals and nursing note reviewed.  Constitutional:      Appearance: Normal appearance.  Cardiovascular:     Rate and Rhythm: Normal rate and regular rhythm.  Pulmonary:     Effort: Pulmonary effort is normal.     Breath sounds: Normal breath sounds.  Musculoskeletal:        General: Normal range of motion.  Skin:    General: Skin is warm and dry.  Neurological:     Mental Status: She is alert.   Psychiatric:        Mood and Affect: Mood normal.        Behavior: Behavior normal.   BP 118/77   Pulse 84   Temp 97.7 F (36.5 C) (Temporal)   Ht 5\' 2"  (1.575 m)   Wt 129 lb 3.2 oz (58.6 kg)   SpO2 96%   BMI 23.63 kg/m  Wt Readings from Last 3 Encounters:  04/20/23 129 lb 3.2 oz (58.6 kg)  02/15/23 126 lb 12.8 oz (57.5 kg)  01/20/22 124 lb 2 oz (56.3 kg)       Dulce Sellar, NP

## 2023-05-07 IMAGING — MG MM DIGITAL SCREENING BILAT W/ TOMO AND CAD
8 series · 9 of 24 positions shown · non-contrast
Comparison: Previous exam(s).

CLINICAL DATA: Screening.

EXAM:
DIGITAL SCREENING BILATERAL MAMMOGRAM WITH TOMOSYNTHESIS AND CAD
TECHNIQUE: Bilateral screening digital craniocaudal and mediolateral oblique
mammograms were obtained. Bilateral screening digital breast
tomosynthesis was performed. The images were evaluated with
computer-aided detection.

[R MLO synth-2D]
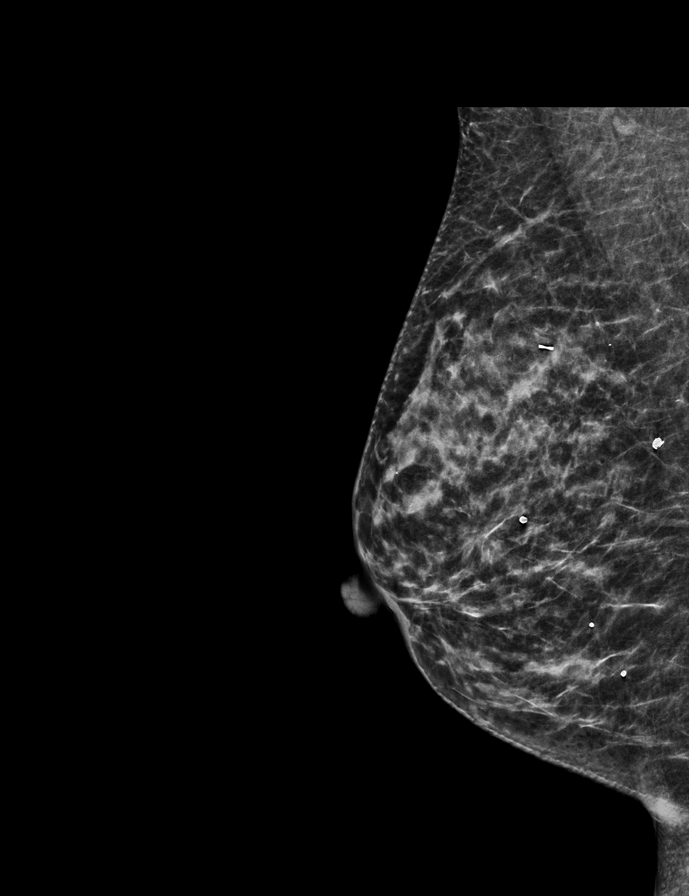

[R CC synth-2D]
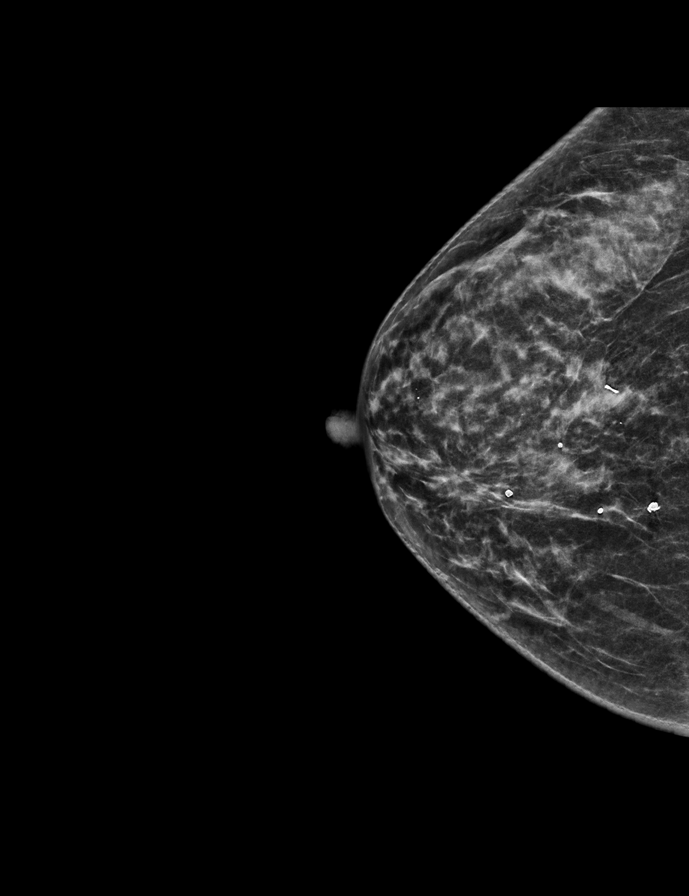

[L MLO synth-2D]
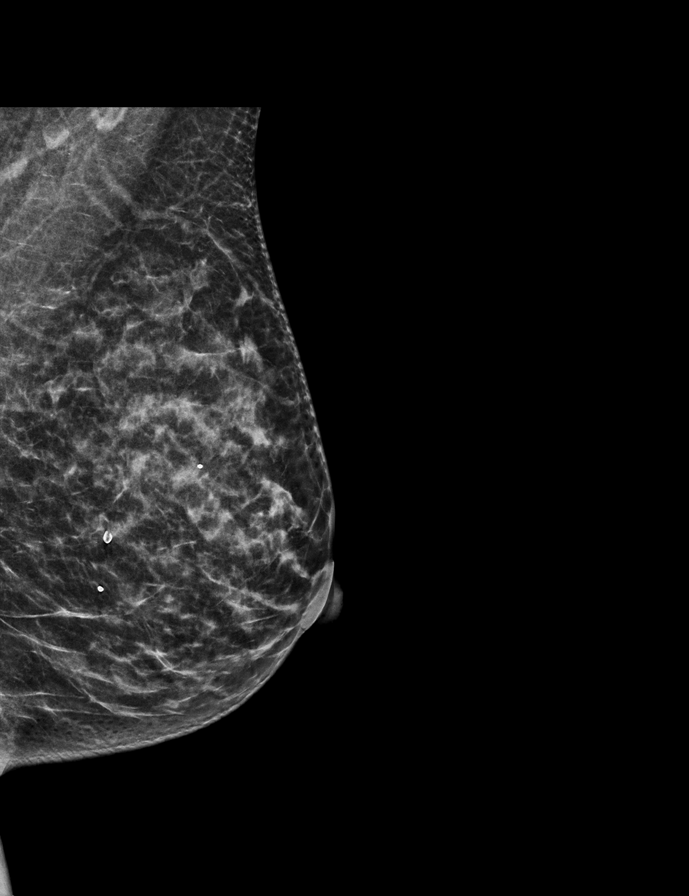

[L CC synth-2D]
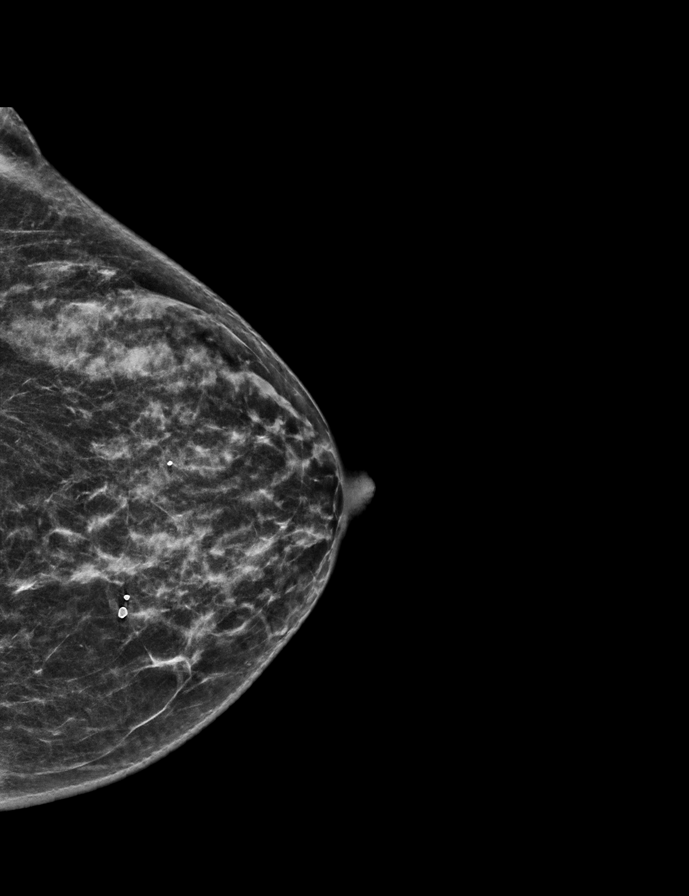

[R CC tomo · 2 of 49 frames shown]
[frame 16/49]
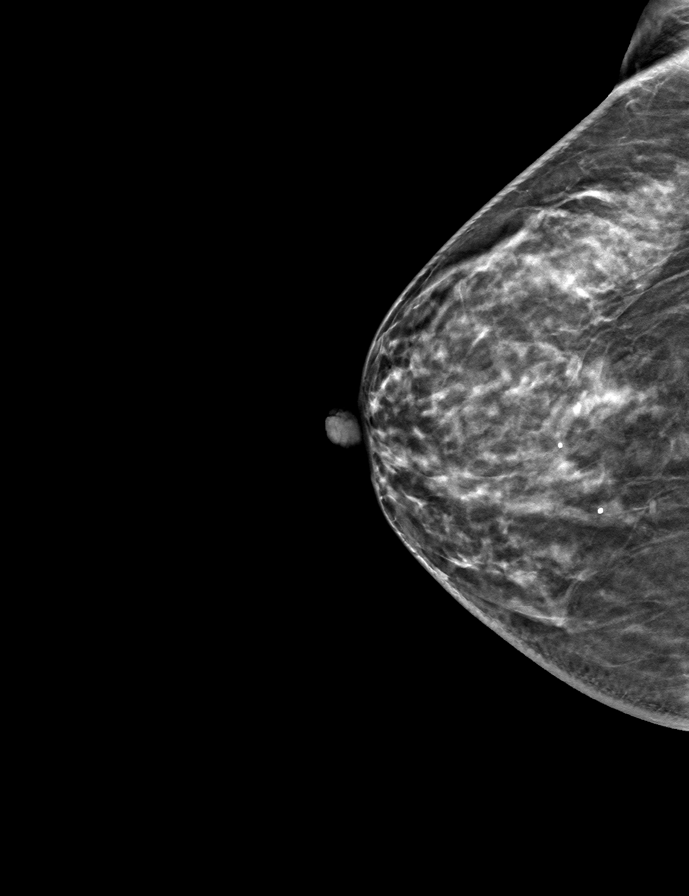
[frame 25/49]
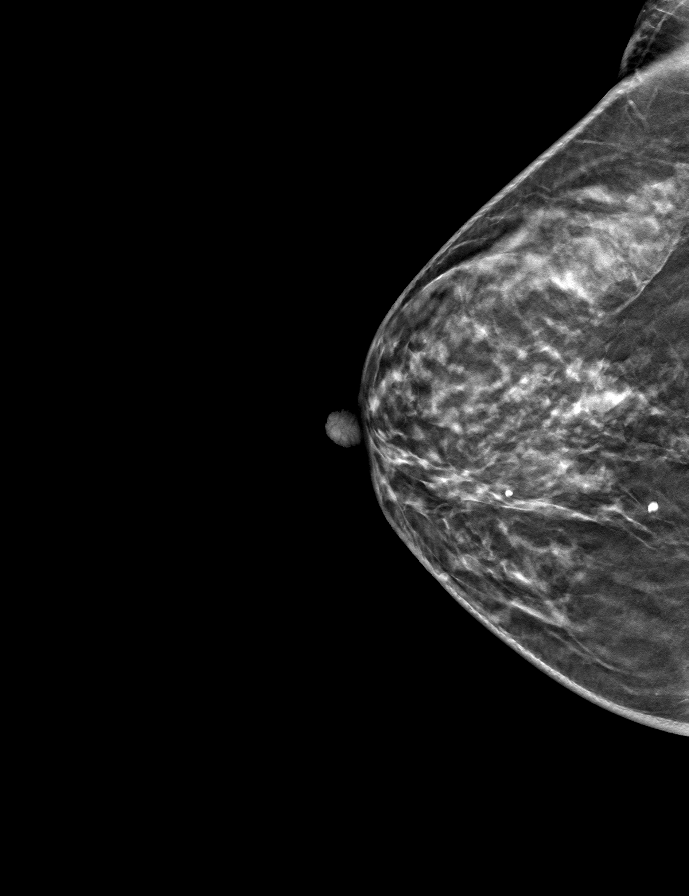

[R MLO tomo · tomo slice 23/45.0]
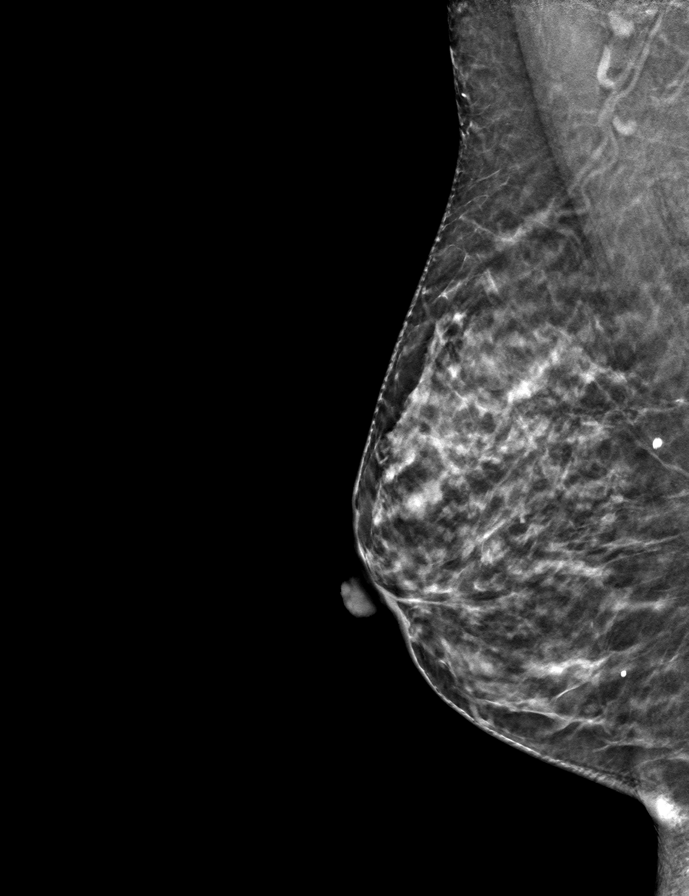

[L MLO tomo · tomo slice 25/48.0]
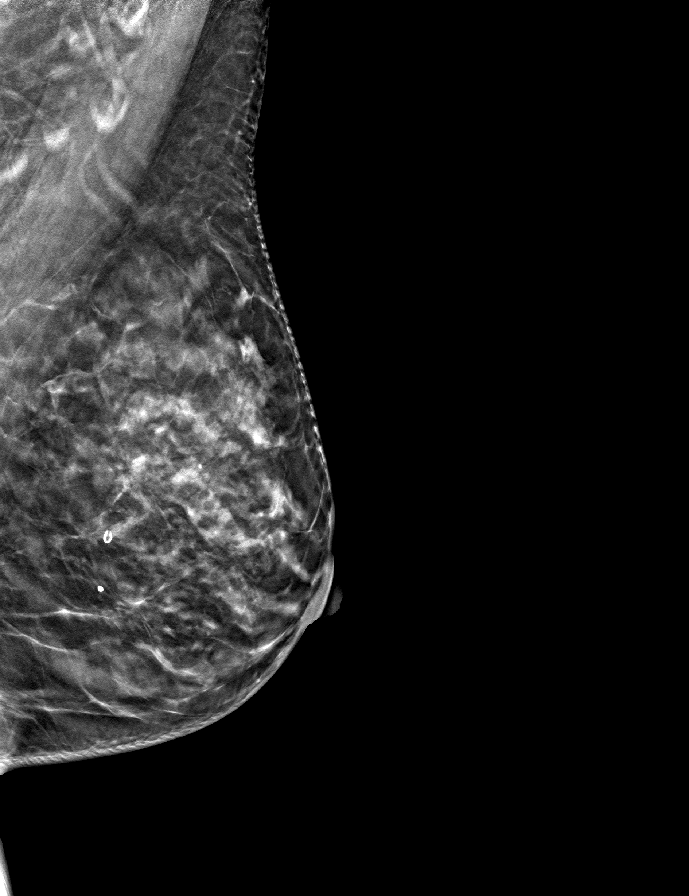

[L CC tomo · tomo slice 25/50.0]
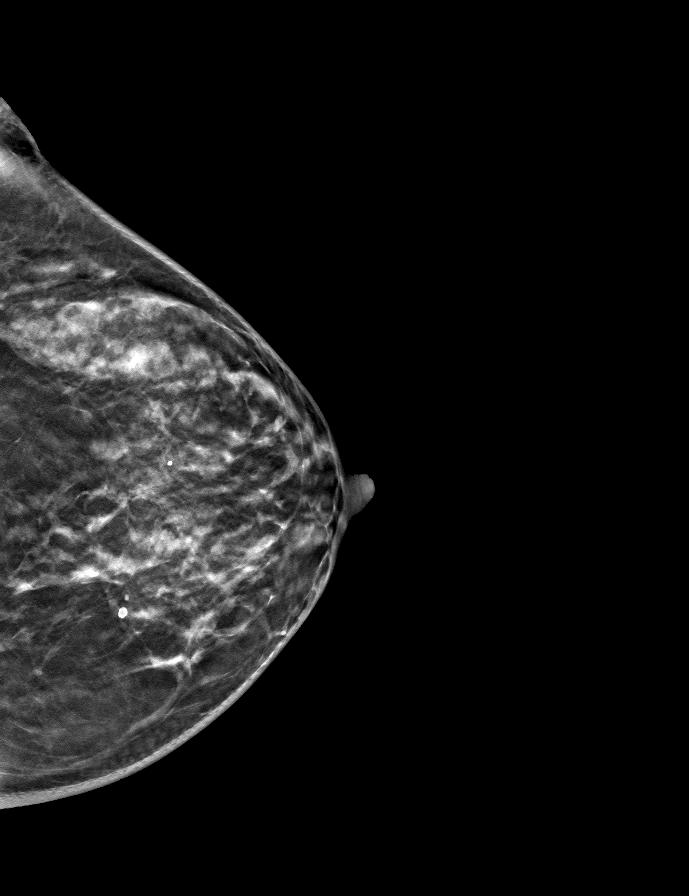

[9 of 24 positions shown; findings below may reference images not displayed]

ACR Breast Density Category c: The breast tissue is heterogeneously
dense, which may obscure small masses.
FINDINGS: There are no findings suspicious for malignancy.
IMPRESSION: No mammographic evidence of malignancy. A result letter of this
screening mammogram will be mailed directly to the patient.

RECOMMENDATION:
Screening mammogram in one year. (Code:Q3-W-BC3)

BI-RADS CATEGORY  1: Negative.

## 2023-07-18 ENCOUNTER — Ambulatory Visit: Payer: Commercial Managed Care - PPO | Admitting: Family

## 2023-07-18 VITALS — BP 126/79 | HR 110 | Temp 98.0°F | Ht 62.0 in | Wt 129.2 lb

## 2023-07-18 DIAGNOSIS — J069 Acute upper respiratory infection, unspecified: Secondary | ICD-10-CM

## 2023-07-18 DIAGNOSIS — R000 Tachycardia, unspecified: Secondary | ICD-10-CM | POA: Diagnosis not present

## 2023-07-18 DIAGNOSIS — H10023 Other mucopurulent conjunctivitis, bilateral: Secondary | ICD-10-CM | POA: Diagnosis not present

## 2023-07-18 DIAGNOSIS — M25562 Pain in left knee: Secondary | ICD-10-CM

## 2023-07-18 DIAGNOSIS — I10 Essential (primary) hypertension: Secondary | ICD-10-CM

## 2023-07-18 LAB — POCT INFLUENZA A/B
Influenza A, POC: NEGATIVE
Influenza B, POC: NEGATIVE

## 2023-07-18 LAB — POC COVID19 BINAXNOW: SARS Coronavirus 2 Ag: NEGATIVE

## 2023-07-18 MED ORDER — METOPROLOL SUCCINATE ER 25 MG PO TB24
25.0000 mg | ORAL_TABLET | Freq: Every day | ORAL | 1 refills | Status: AC
Start: 2023-07-18 — End: ?

## 2023-07-18 MED ORDER — BENZONATATE 100 MG PO CAPS
100.0000 mg | ORAL_CAPSULE | Freq: Three times a day (TID) | ORAL | 0 refills | Status: AC | PRN
Start: 2023-07-18 — End: ?

## 2023-07-18 MED ORDER — GUAIFENESIN-CODEINE 100-10 MG/5ML PO SYRP
5.0000 mL | ORAL_SOLUTION | Freq: Three times a day (TID) | ORAL | 0 refills | Status: AC | PRN
Start: 2023-07-18 — End: ?

## 2023-07-18 MED ORDER — AZELASTINE HCL 0.05 % OP SOLN
1.0000 [drp] | Freq: Two times a day (BID) | OPHTHALMIC | 1 refills | Status: AC
Start: 2023-07-18 — End: 2023-07-25

## 2023-07-18 NOTE — Progress Notes (Signed)
Patient ID: Maria Shannon, female    DOB: [birth year], 57 y.o.   MRN: [id number]  Chief Complaint  Patient presents with   Cough    Pt c/o Cough with green/grey mucus, left pink eye,  headache,chills, body aches, Present since Thursday. Has tried tylenol and mucinex which did not help sx.    Hypertension    Medication refill   Discussed the use of AI scribe software for clinical note transcription with the patient, who gave verbal consent to proceed.  History of Present Illness   The patient, with a history of elevated heart rate and blood pressure, presents with ongoing symptoms. She has been taking metoprolol as needed when she feels her heart rate is elevated or when her blood pressure readings at home reach 140. She reports that her blood pressure typically ranges from 110-120 over 60-80. She is also managing her blood pressure with diet, low salt intake, and hydration. She reports some exercise, which is beneficial for her heart health.  In addition to her cardiovascular concerns, the patient has been experiencing upper respiratory symptoms for about four days, including a headache, cold symptoms, and a cough that has been keeping her up at night. She has been managing these symptoms with Mucinex D and Tylenol, but reports that the Tylenol has not been effective for her headache. She also reports having a pink eye, which started around the same time as her respiratory symptoms.  Lastly, the patient reports ongoing right knee pain, which also affects the side of her leg. She has not yet had any imaging done for this issue, but expresses a desire to have an x-ray to better understand the cause of her pain.     Assessment & Plan:     Tachycardia - Patient taking Metoprolol as needed for elevated heart rate and sometimes for elevated blood pressure. Discussed the importance of consistent dosing if seeing persistent symptoms, vs prn dosing. -Continue Metoprolol 25mg  daily dosing for  better control.  Viral Upper Respiratory Infection - Symptoms of headache, cough, and congestion for 4 days. Negative for COVID-19 and flu. -Continue Mucinex D thru Thursday, and Tylenol as needed. -Drink plenty of fluids. -Consider antibiotic if symptoms do not improve by Friday.  Conjunctivitis - Itchy, watery eyes with discharge since Sunday. -Prescribed Astelin antihistamine eye drops.  Knee Pain - Chronic knee pain, previously discussed referral to sports medicine. -Refer to sports medicine for further evaluation and imaging as needed.    Subjective:    Outpatient Medications Prior to Visit  Medication Sig Dispense Refill   metoprolol succinate (TOPROL-XL) 25 MG 24 hr tablet Take 1 tablet (25 mg total) by mouth daily. 90 tablet 3   Ascorbic Acid (VITAMIN C PO) Take by mouth. (Patient not taking: Reported on 07/18/2023)     meloxicam (MOBIC) 7.5 MG tablet Take 1 tablet (7.5 mg total) by mouth 2 (two) times daily. (Patient not taking: Reported on 07/18/2023) 60 tablet 1   No facility-administered medications prior to visit.   Past Medical History:  Diagnosis Date   Abnormal Pap smear of cervix    pap 01-20-22 ascus, 01-13-22 hpv hr+, 16,18/45 neg   Lumbar herniated disc    STD (sexually transmitted disease)    hpv   Tachycardia    Thyroid disease    hyperthyroid   Past Surgical History:  Procedure Laterality Date   BREAST BIOPSY Right    BREAST CYST EXCISION Right    No Known Allergies  Objective:    Physical Exam Vitals and nursing note reviewed.  Constitutional:      Appearance: Normal appearance. She is ill-appearing.     Interventions: Face mask in place.  HENT:     Right Ear: Tympanic membrane and ear canal normal.     Left Ear: Tympanic membrane and ear canal normal.     Nose: Congestion and rhinorrhea present.     Right Sinus: No frontal sinus tenderness (pressure).     Left Sinus: No frontal sinus tenderness (pressure).     Mouth/Throat:     Mouth:  Mucous membranes are moist.     Pharynx: No pharyngeal swelling, oropharyngeal exudate, posterior oropharyngeal erythema or uvula swelling.     Tonsils: No tonsillar exudate or tonsillar abscesses.  Eyes:     Extraocular Movements: Extraocular movements intact.     Conjunctiva/sclera:     Right eye: Right conjunctiva is injected (mild in conjunctiva). Exudate (watery during exam, yellow in am) present.     Left eye: Left conjunctiva is injected (left > right eye - conjunctiva & sclera). Exudate (watery during exam, yellow in am) present.  Cardiovascular:     Rate and Rhythm: Normal rate and regular rhythm.  Pulmonary:     Effort: Pulmonary effort is normal.     Breath sounds: Normal breath sounds.  Musculoskeletal:        General: Normal range of motion.  Lymphadenopathy:     Head:     Right side of head: No preauricular or posterior auricular adenopathy.     Left side of head: No preauricular or posterior auricular adenopathy.     Cervical: No cervical adenopathy.  Skin:    General: Skin is warm and dry.  Neurological:     Mental Status: She is alert.  Psychiatric:        Mood and Affect: Mood normal.        Behavior: Behavior normal.    BP 126/79   Pulse (!) 110   Temp 98 F (36.7 C) (Temporal)   Ht 5\' 2"  (1.575 m)   Wt 129 lb 3.2 oz (58.6 kg)   SpO2 98%   BMI 23.63 kg/m  Wt Readings from Last 3 Encounters:  07/18/23 129 lb 3.2 oz (58.6 kg)  04/20/23 129 lb 3.2 oz (58.6 kg)  02/15/23 126 lb 12.8 oz (57.5 kg)      Dulce Sellar, NP

## 2023-07-18 NOTE — Assessment & Plan Note (Signed)
chronic, stable ?

## 2023-08-05 ENCOUNTER — Encounter (INDEPENDENT_AMBULATORY_CARE_PROVIDER_SITE_OTHER): Payer: Commercial Managed Care - PPO | Admitting: Family

## 2023-08-05 DIAGNOSIS — R053 Chronic cough: Secondary | ICD-10-CM

## 2023-08-05 DIAGNOSIS — H10023 Other mucopurulent conjunctivitis, bilateral: Secondary | ICD-10-CM

## 2023-08-05 MED ORDER — POLYMYXIN B-TRIMETHOPRIM 10000-0.1 UNIT/ML-% OP SOLN: 1.0000 [drp] | OPHTHALMIC | 0 refills | Status: AC

## 2023-08-05 MED ORDER — AZITHROMYCIN 250 MG PO TABS
ORAL_TABLET | ORAL | 0 refills | Status: AC
Start: 2023-08-05 — End: 2023-08-10

## 2023-08-05 NOTE — Telephone Encounter (Signed)
Please see the MyChart message reply(ies) for my assessment and plan.  The patient gave consent for this Medical Advice Message and is aware that it may result in a bill to their insurance company as well as the possibility that this may result in a co-payment or deductible. They are an established patient, but are not seeking medical advice exclusively about a problem treated during an in person or video visit in the last 7 days. I did not recommend an in person or video visit within 7 days of my reply.  I spent a total of 13 minutes cumulative time within 7 days through [employer] Dulce Sellar, NP

## 2024-10-03 ENCOUNTER — Other Ambulatory Visit: Payer: Self-pay | Admitting: Family

## 2024-10-03 DIAGNOSIS — R000 Tachycardia, unspecified: Secondary | ICD-10-CM

## 2024-10-03 DIAGNOSIS — I10 Essential (primary) hypertension: Secondary | ICD-10-CM

## 2024-10-04 ENCOUNTER — Other Ambulatory Visit: Payer: Self-pay | Admitting: Family

## 2024-10-04 DIAGNOSIS — I10 Essential (primary) hypertension: Secondary | ICD-10-CM

## 2024-10-04 DIAGNOSIS — R000 Tachycardia, unspecified: Secondary | ICD-10-CM

## 2024-10-04 NOTE — Telephone Encounter (Signed)
 Copied from CRM [id number]. Topic: Clinical - Medication Refill >> Oct 04, 2024 12:10 PM Zy'onna H wrote: Medication:  metoprolol  succinate (TOPROL -XL) 25 MG 24 hr tablet **ATTN** Patient stated during call she doesn't feel the need to see PCP or anything as she doesn't feel ill. She is just requesting her medication be refilled.   Has the patient contacted their pharmacy? Yes (Agent: If no, request that the patient contact the pharmacy for the refill. If patient does not wish to contact the pharmacy document the reason why and proceed with request.) (Agent: If yes, when and what did the pharmacy advise?)  This is the patient's preferred pharmacy:  [id number] - [place], [place] - [address] [place] [address] Phone: [phone] Fax: [phone]  Is this the correct pharmacy for this prescription? Yes If no, delete pharmacy and type the correct one.   Has the prescription been filled recently? Yes  Is the patient out of the medication? Yes  Has the patient been seen for an appointment in the last year OR does the patient have an upcoming appointment? Yes  Can we respond through MyChart? Yes  Agent: Please be advised that Rx refills may take up to 3 business days. We ask that you follow-up with your pharmacy.
